# Patient Record
Sex: Female | Born: 1995 | Race: Black or African American | Hispanic: No | Marital: Single | Smoking: Never smoker
Health system: Southern US, Community
[De-identification: ages and names within clinical notes are randomized; demographics above are authoritative.]

## PROBLEM LIST (undated history)

## (undated) DIAGNOSIS — G43909 Migraine, unspecified, not intractable, without status migrainosus: Secondary | ICD-10-CM

---
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
## 2017-06-08 IMAGING — US US ABDOMEN COMPLETE
1 series · 14 of 25 positions shown · non-contrast
Comparison: None.

CLINICAL DATA: Epigastric pain with nausea

EXAM:
ABDOMEN ULTRASOUND COMPLETE

[Series 1: us abdomen complete · 0.12mm/px · 14 of 84 slices shown]
[im 1/84]
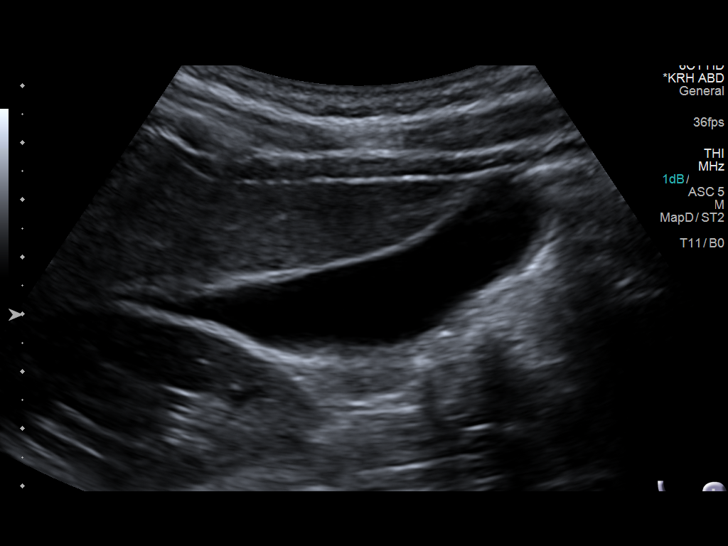
[im 7/84]
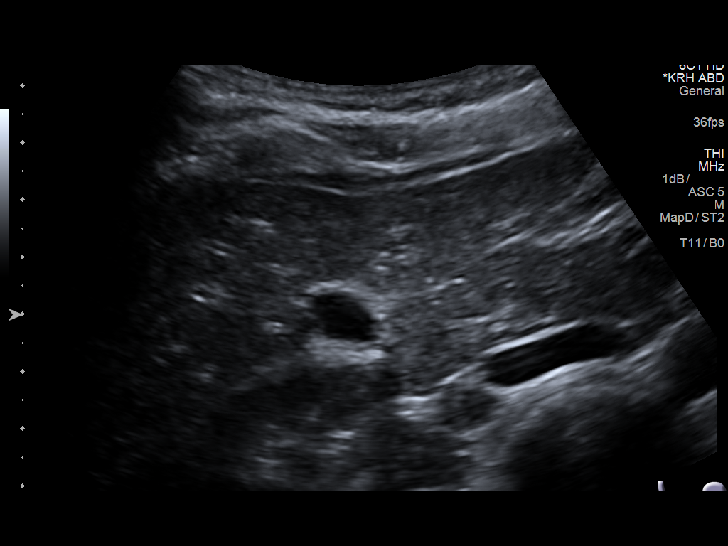
[im 14/84]
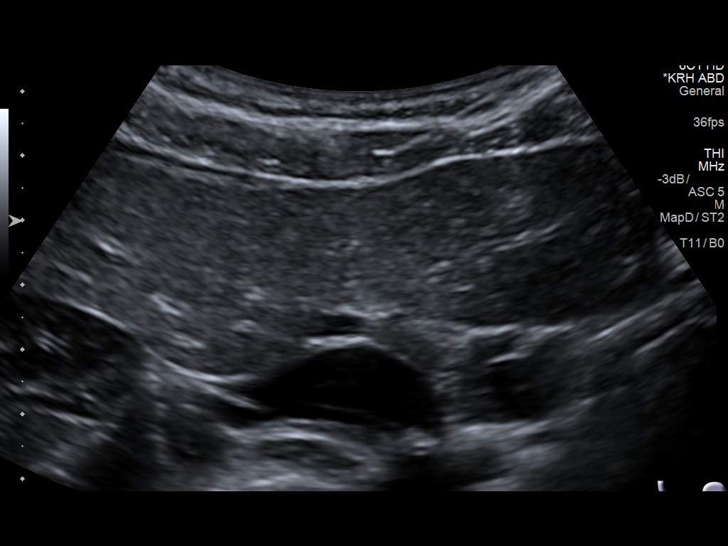
[im 21/84]
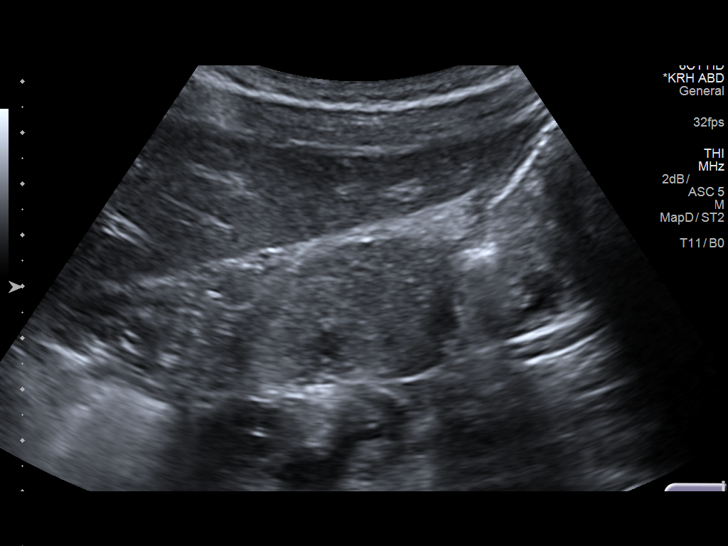
[im 28/84]
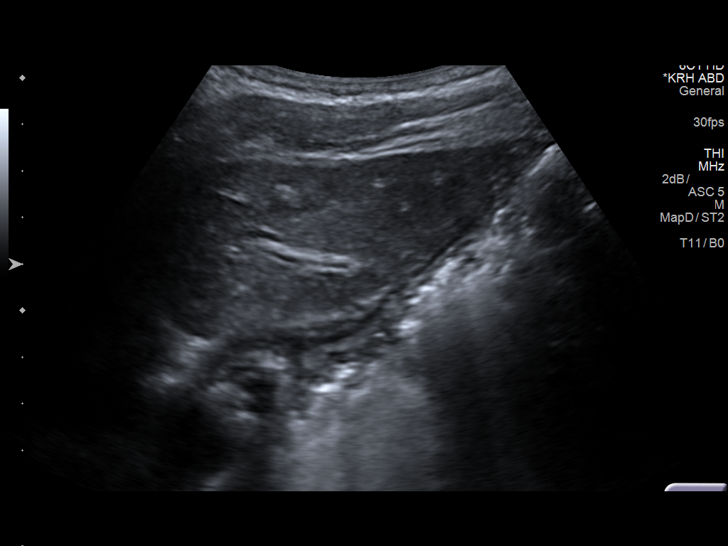
[im 32/84]
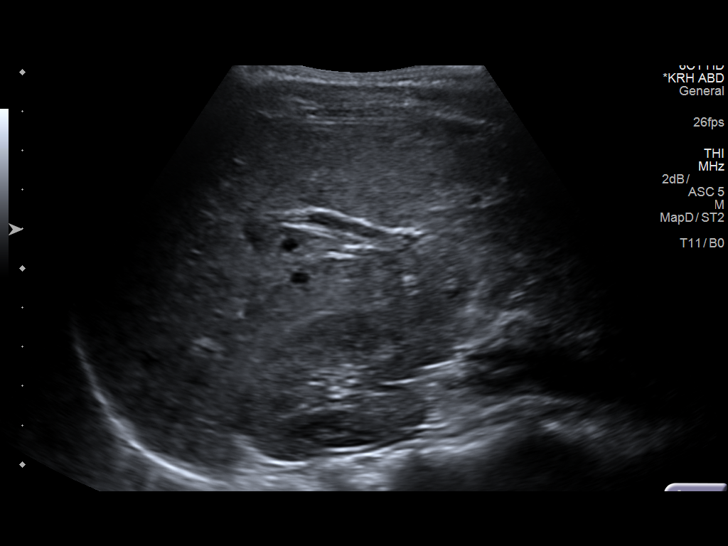
[im 39/84]
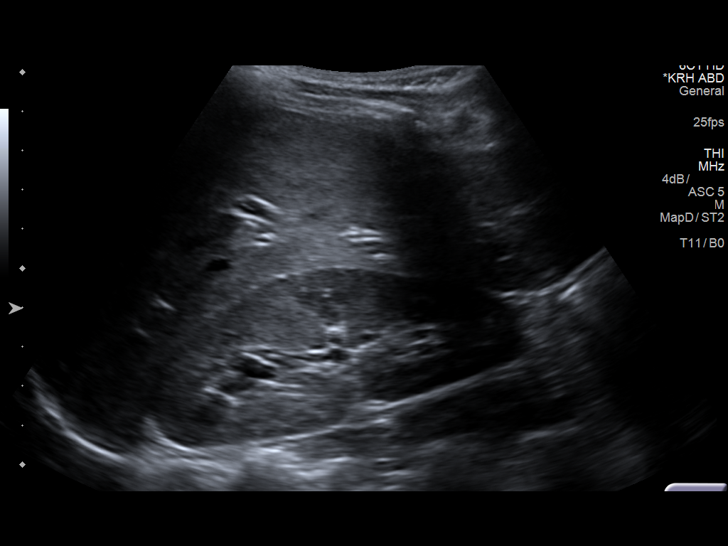
[im 45/84]
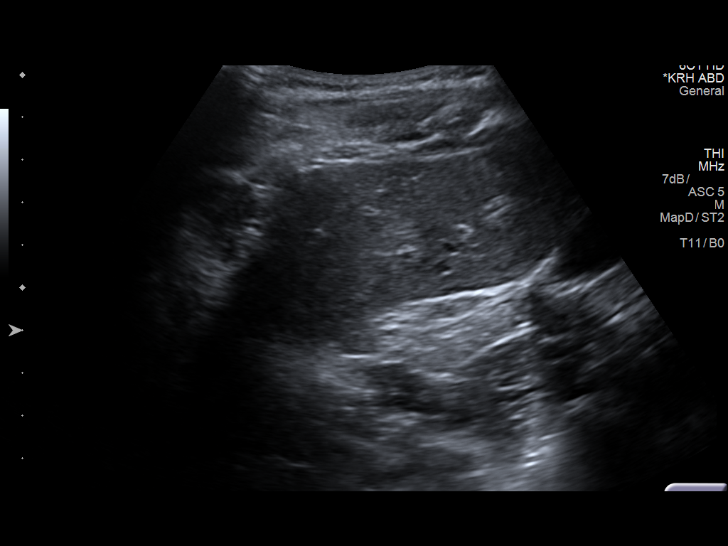
[im 52/84]
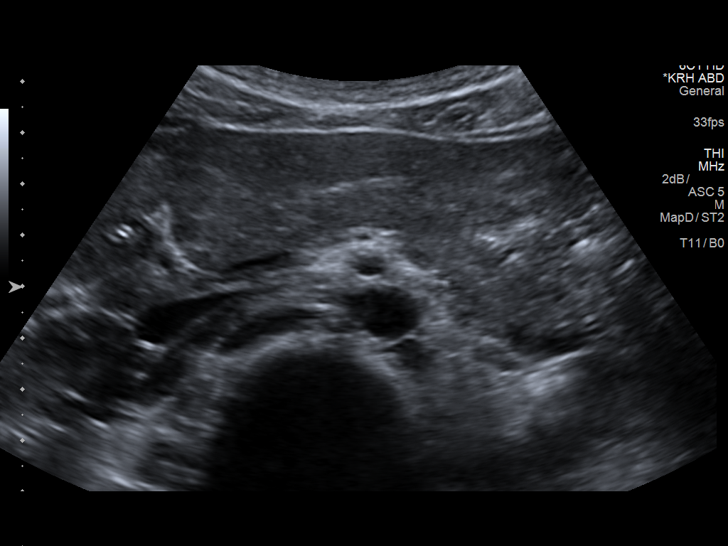
[im 56/84]
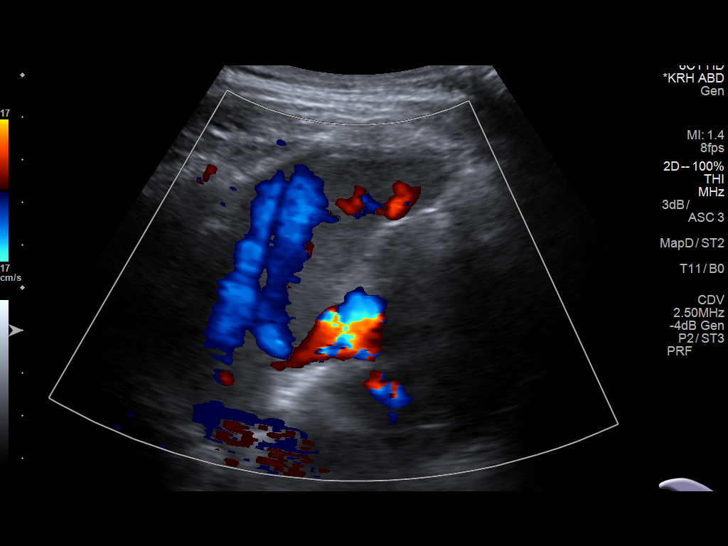
[im 63/84]
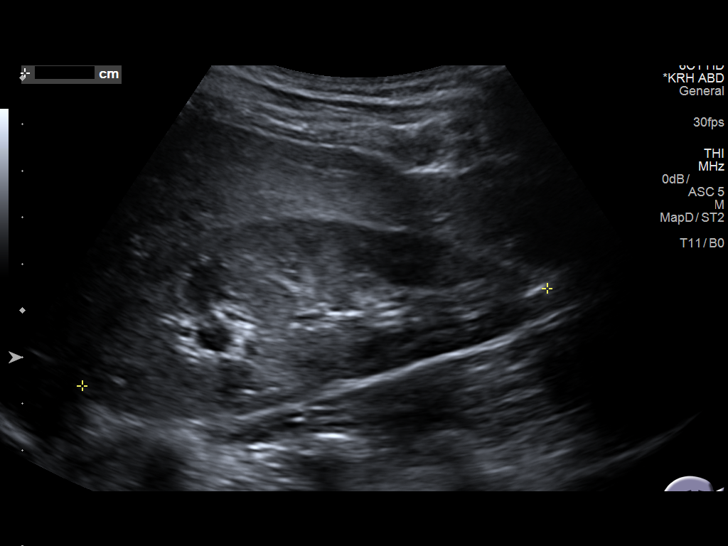
[im 70/84]
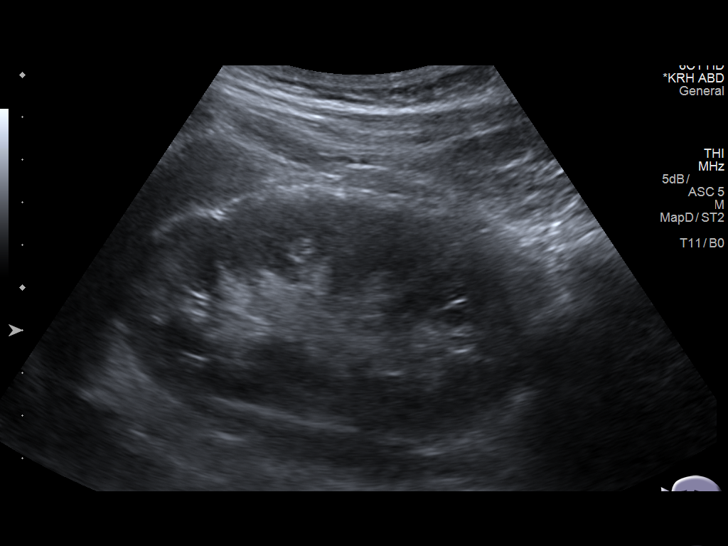
[im 77/84]
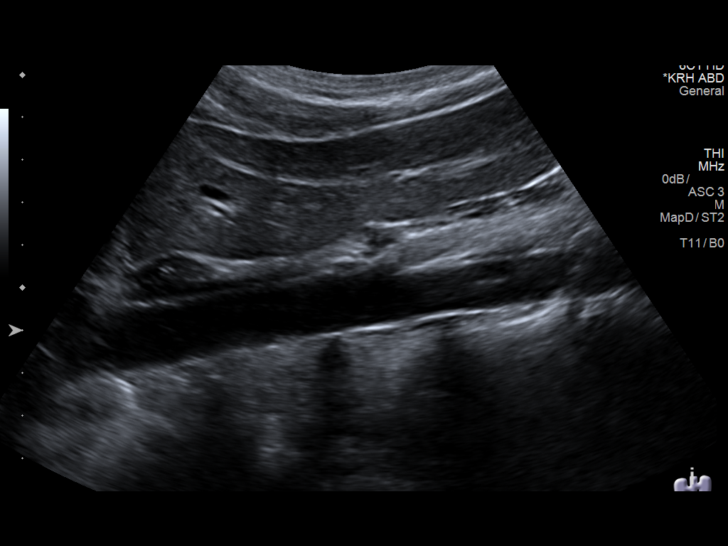
[im 84/84]
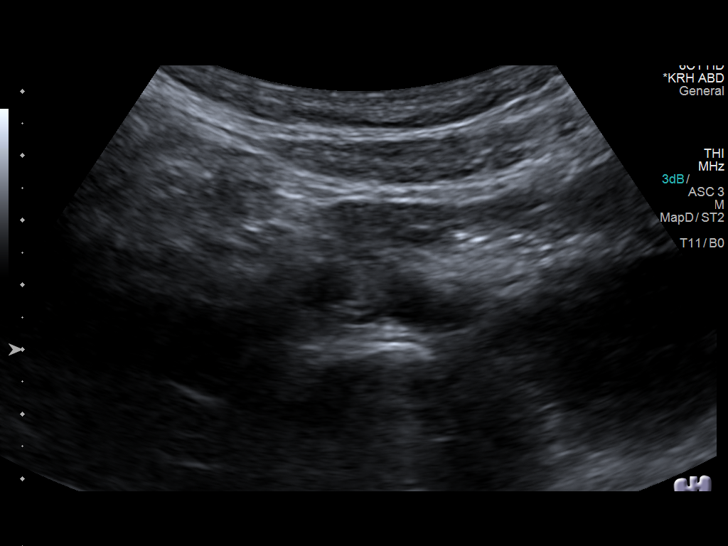

[14 of 25 positions shown; findings below may reference images not displayed]

FINDINGS: Gallbladder: No gallstones or wall thickening visualized. No
sonographic Murphy sign noted by sonographer.

Common bile duct: Diameter: 1.3 mm

Liver: No focal lesion identified. Within normal limits in
parenchymal echogenicity.

IVC: No abnormality visualized.

Pancreas: Visualized portion unremarkable.

Spleen: Size and appearance within normal limits.

Right Kidney: Length: 10.2 cm. Echogenicity within normal limits. No
mass or hydronephrosis visualized.

Left Kidney: Length: 10.3 cm. Echogenicity within normal limits. No
mass or hydronephrosis visualized.

Abdominal aorta: No aneurysm visualized.

Other findings: None.
IMPRESSION: Negative abdominal ultrasound

## 2017-07-30 IMAGING — CR DG CHEST 2V
2 series · 2 of 2 positions shown · non-contrast
Comparison: None.

CLINICAL DATA: Epigastric pain

EXAM:
CHEST  2 VIEW

[w chest pa]
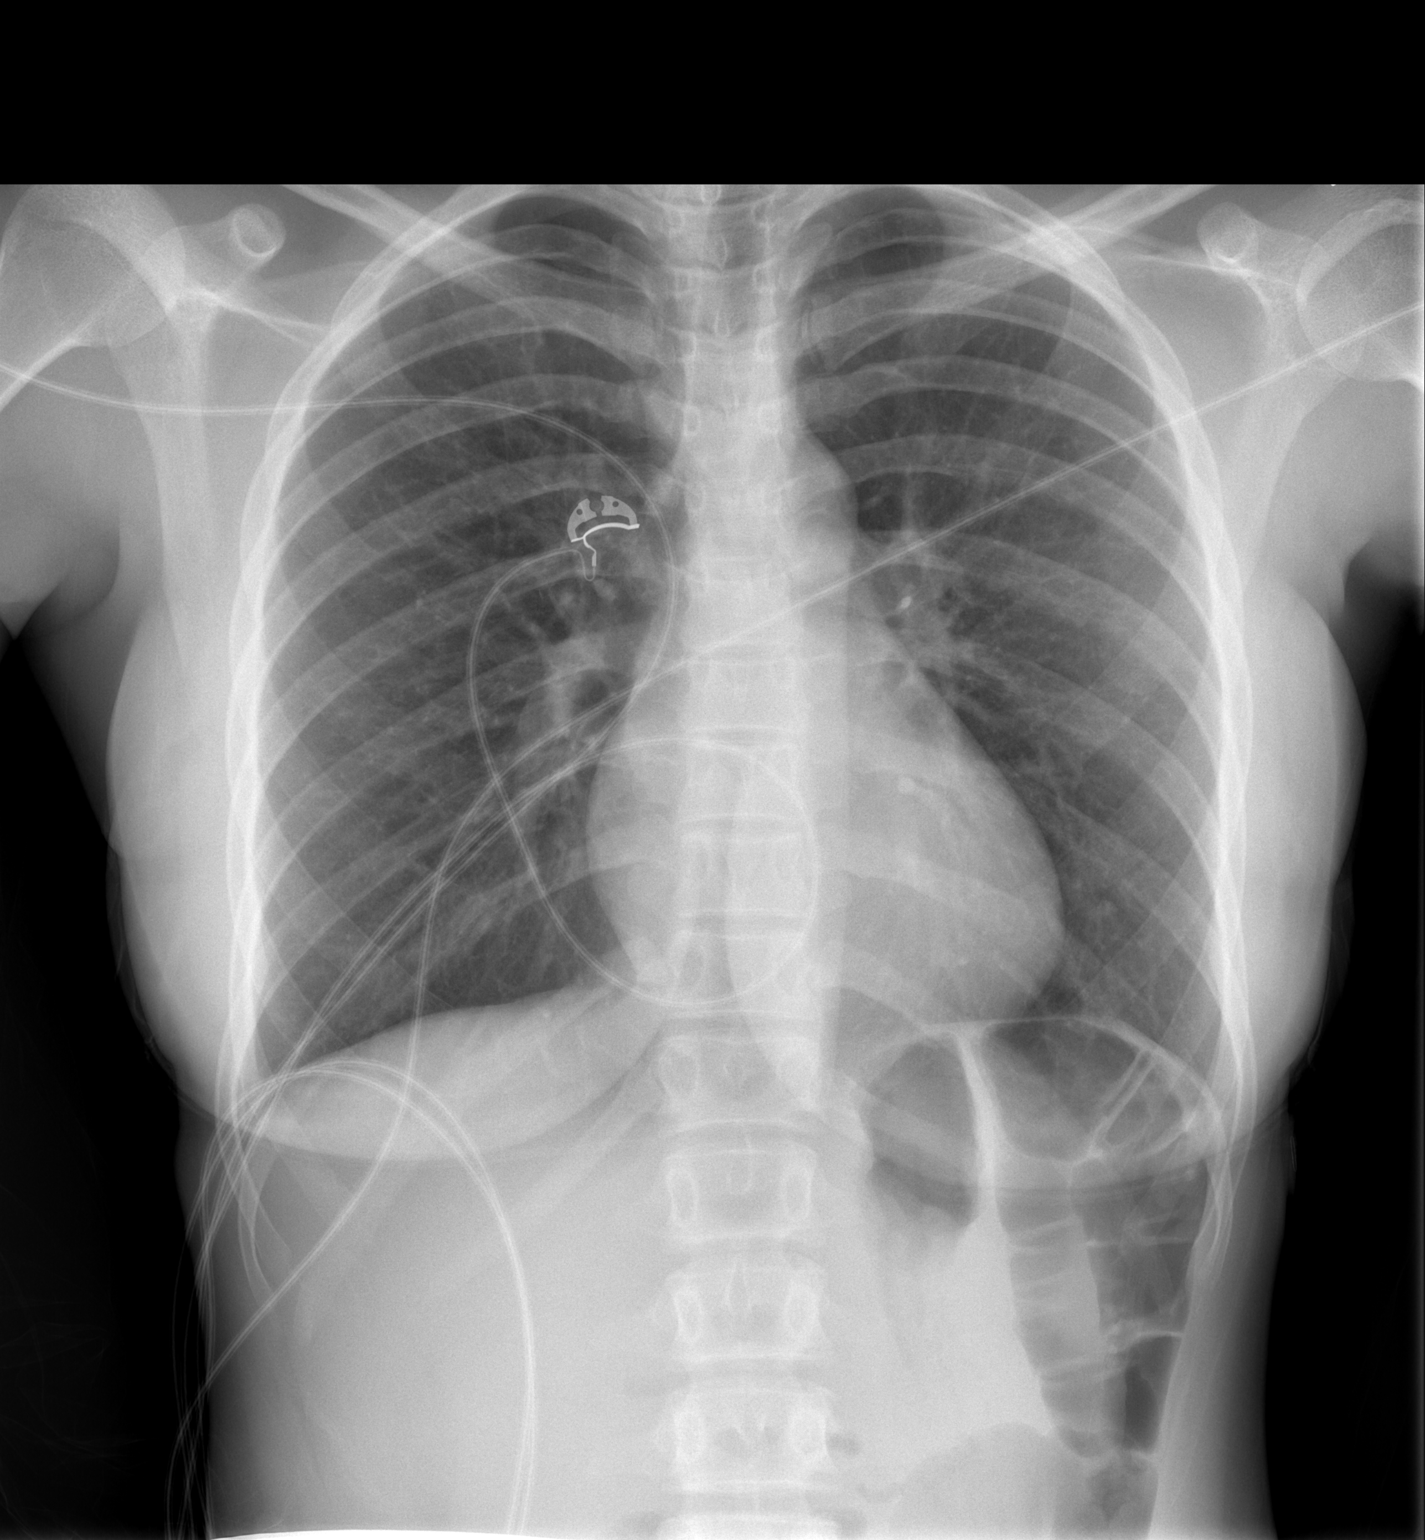

[w chest lat]
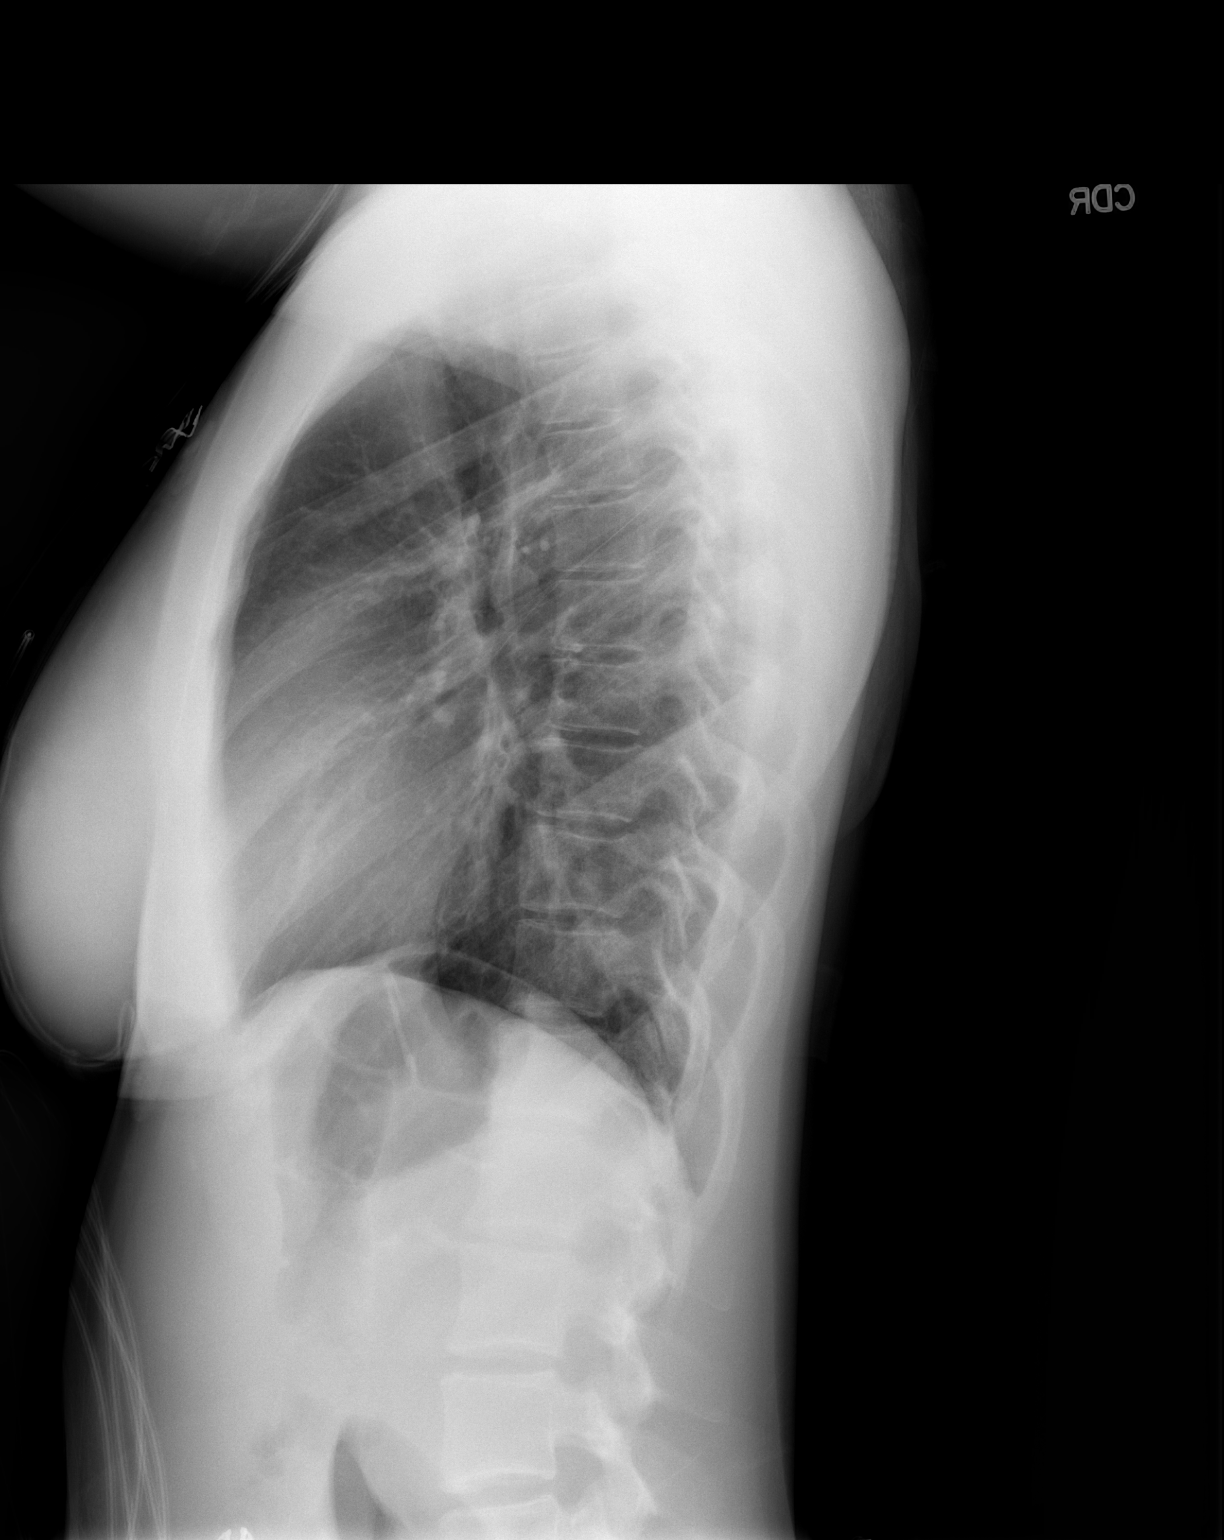

[2 of 2 positions shown; findings below may reference images not displayed]

FINDINGS: The heart size and mediastinal contours are within normal limits.
Both lungs are clear. The visualized skeletal structures are
unremarkable.
IMPRESSION: No active cardiopulmonary disease.
